# Patient Record
Sex: Male | Born: 1975 | Race: White | Hispanic: No | Marital: Single | Smoking: Never smoker
Health system: Southern US, Community
[De-identification: ages and names within clinical notes are randomized; demographics above are authoritative.]

---

## 2004-10-26 ENCOUNTER — Emergency Department: Payer: Self-pay | Admitting: Emergency Medicine

## 2006-03-28 IMAGING — CR DG KNEE COMPLETE 4+V*L*
1 series · 4 of 4 positions shown · non-contrast
Comparison: none

REASON FOR EXAM: injury
COMMENTS:

PROCEDURE:     DXR - DXR KNEE LT COMP WITH OBLIQUES  - October 26, 2004 [DATE]
RESULT:       Multiple views reveals no fractures or dislocations.  The
joint space is intact.  No joint effusion is identified.

[Series 1: view not recorded · 0.17mm/px · 4 of 4 slices shown]
[im 1/4]
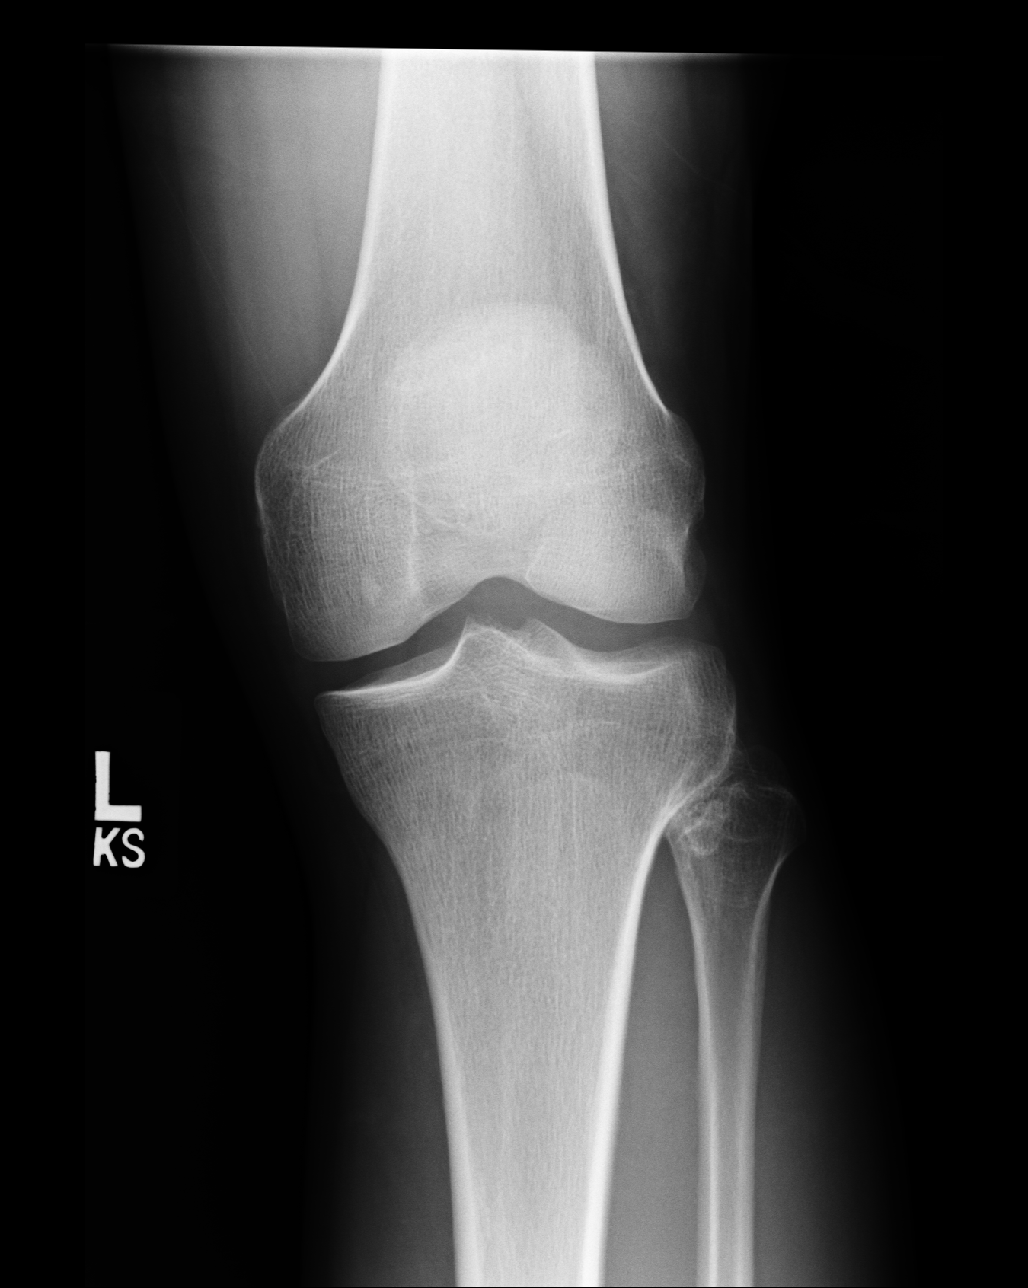
[im 2/4]
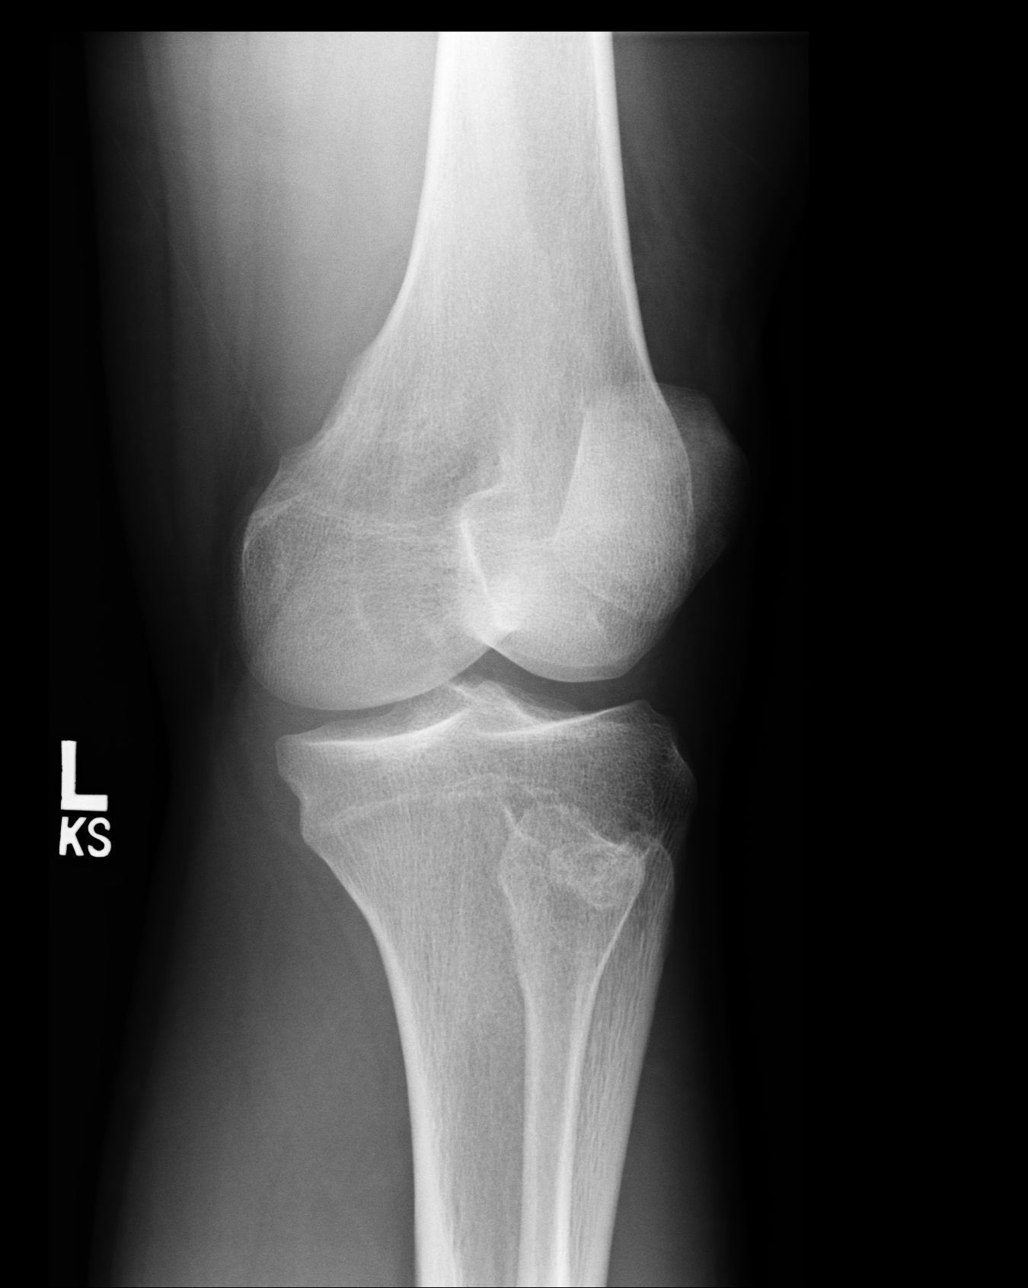
[im 3/4]
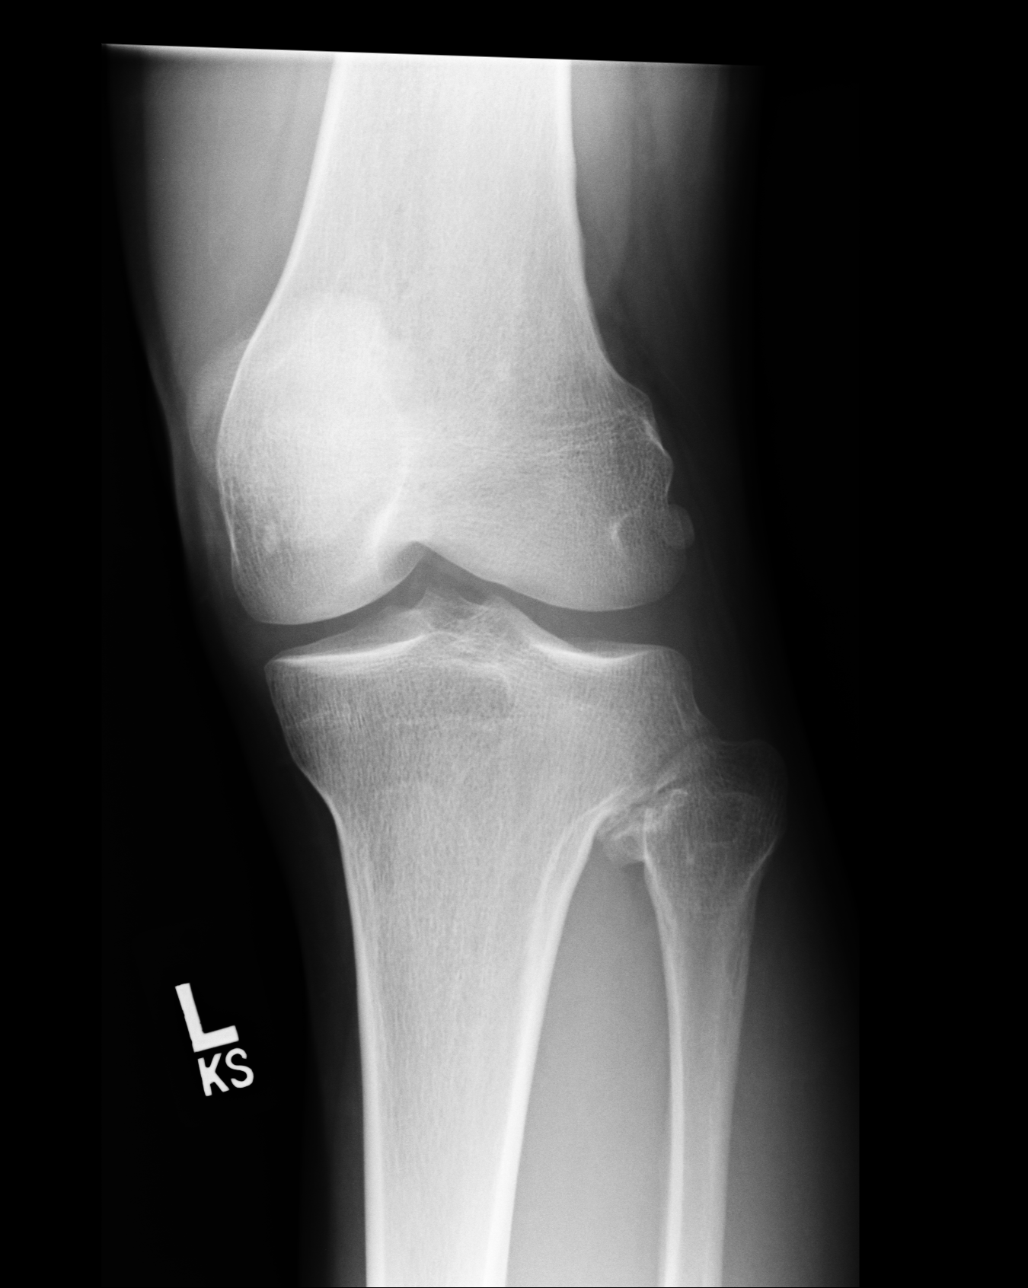
[im 4/4]
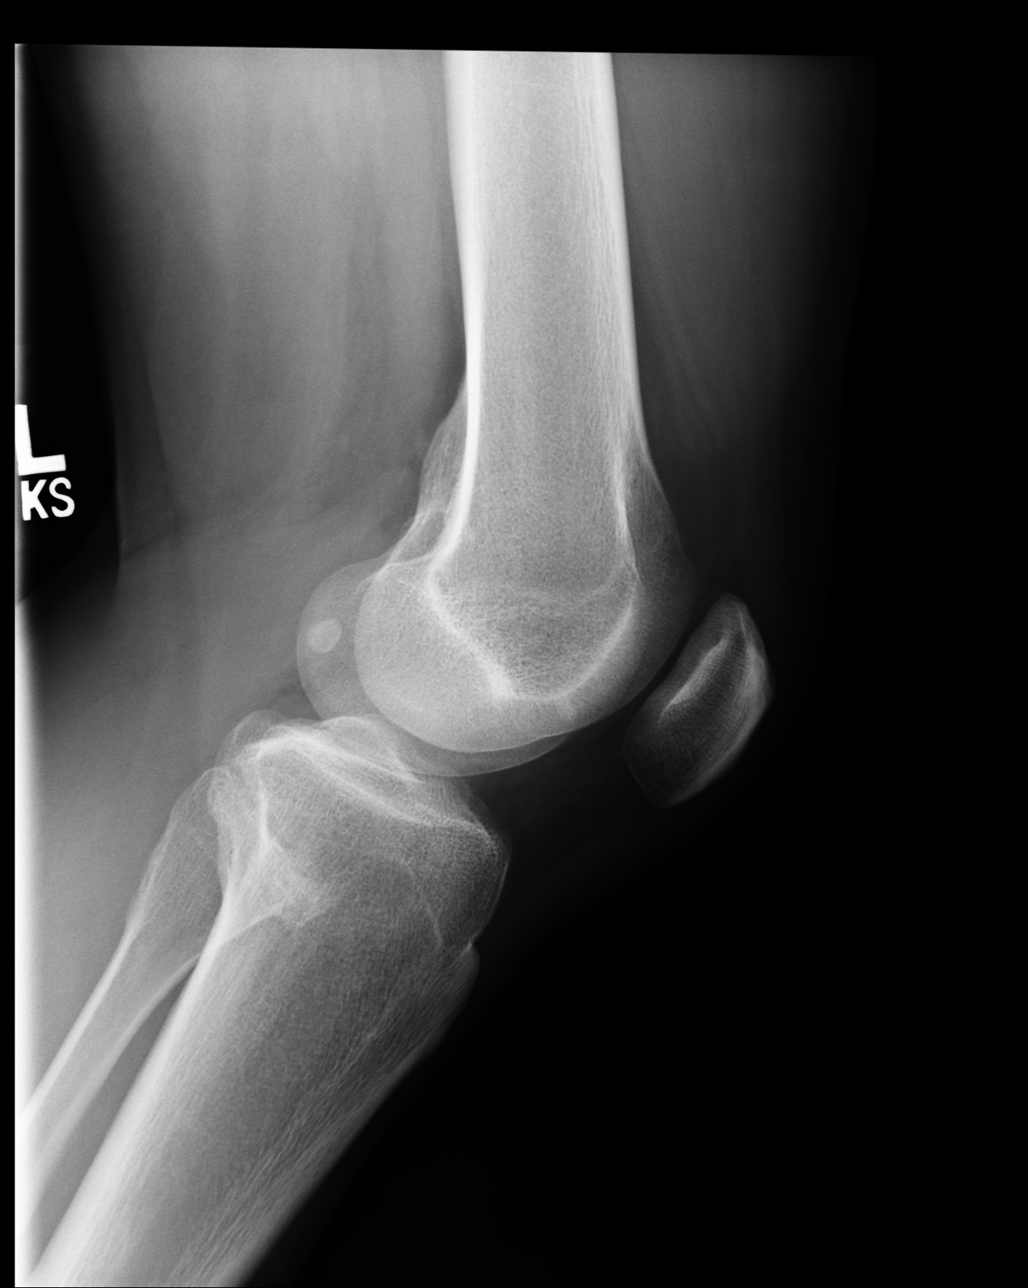

[4 of 4 positions shown; findings below may reference images not displayed]

IMPRESSION: No acute fracture seen of the LEFT knee.

## 2010-10-24 ENCOUNTER — Ambulatory Visit: Payer: Self-pay | Admitting: Family Medicine

## 2010-10-30 ENCOUNTER — Ambulatory Visit: Payer: Self-pay | Admitting: Family Medicine

## 2010-11-04 ENCOUNTER — Ambulatory Visit: Payer: Self-pay | Admitting: Family Medicine

## 2012-03-25 IMAGING — CR DG LUMBAR SPINE COMPLETE 4+V
1 series · 5 of 5 positions shown · non-contrast
Comparison: none

REASON FOR EXAM: lower back pain due to work injury
COMMENTS:

PROCEDURE:     MDR - M[REDACTED] SPINE WITH OBLIQUES  - October 24, 2010  [DATE]
RESULT:     Comparison: None.

[Series 1: view not recorded · 0.17mm/px · 5 of 5 slices shown]
[im 1/5]
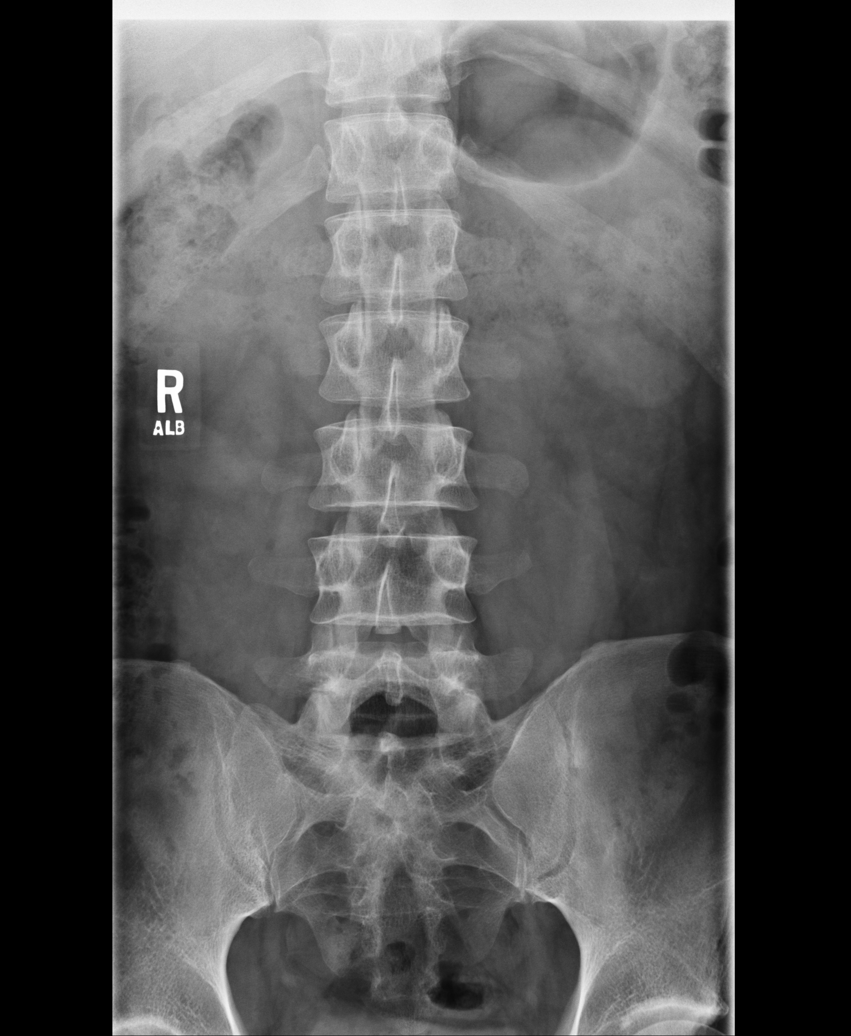
[im 2/5]
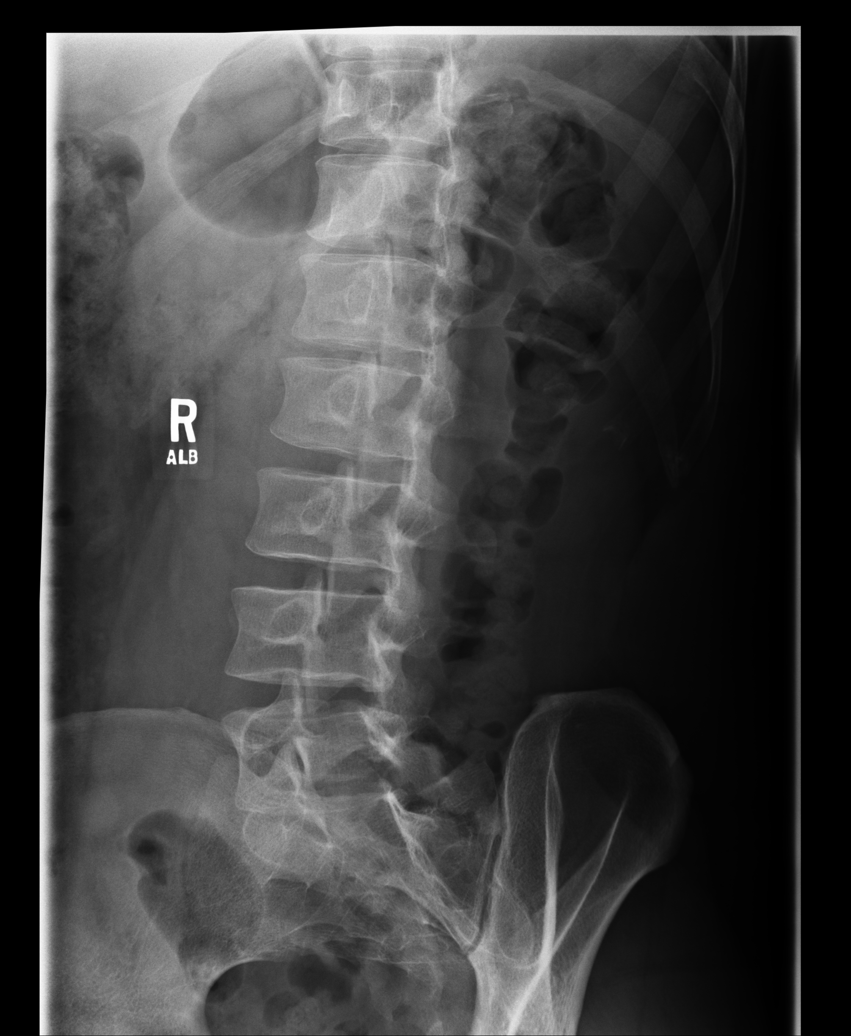
[im 3/5]
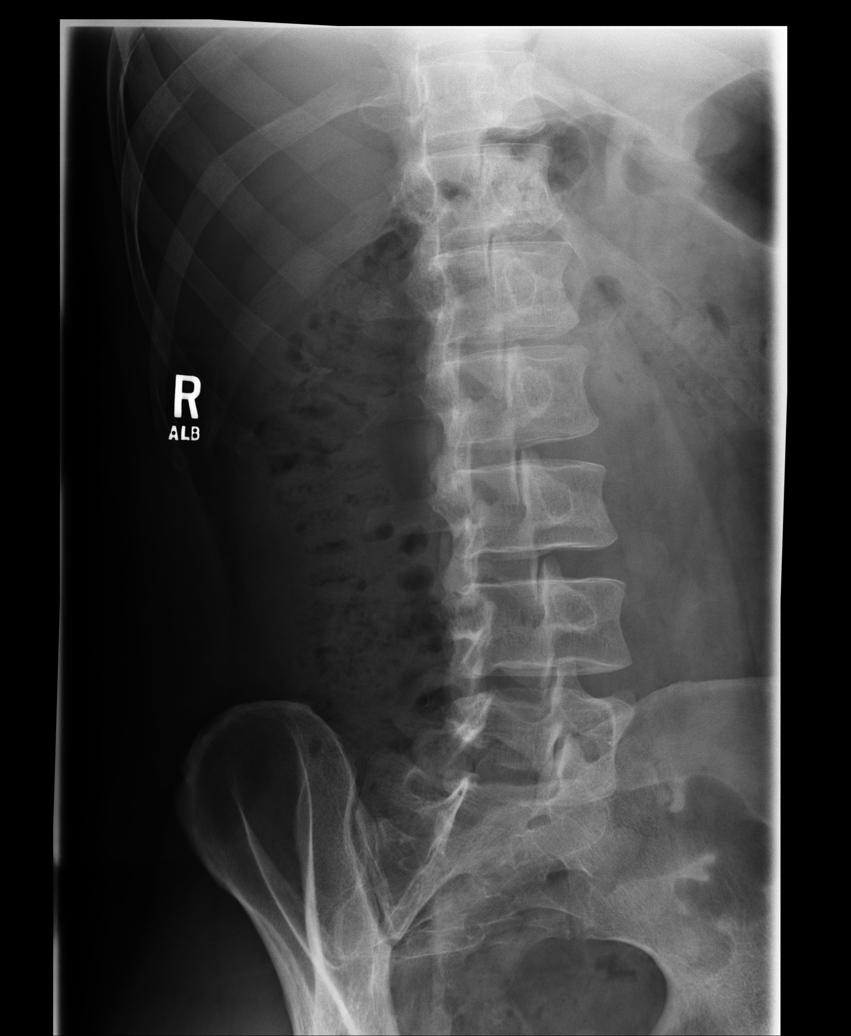
[im 4/5]
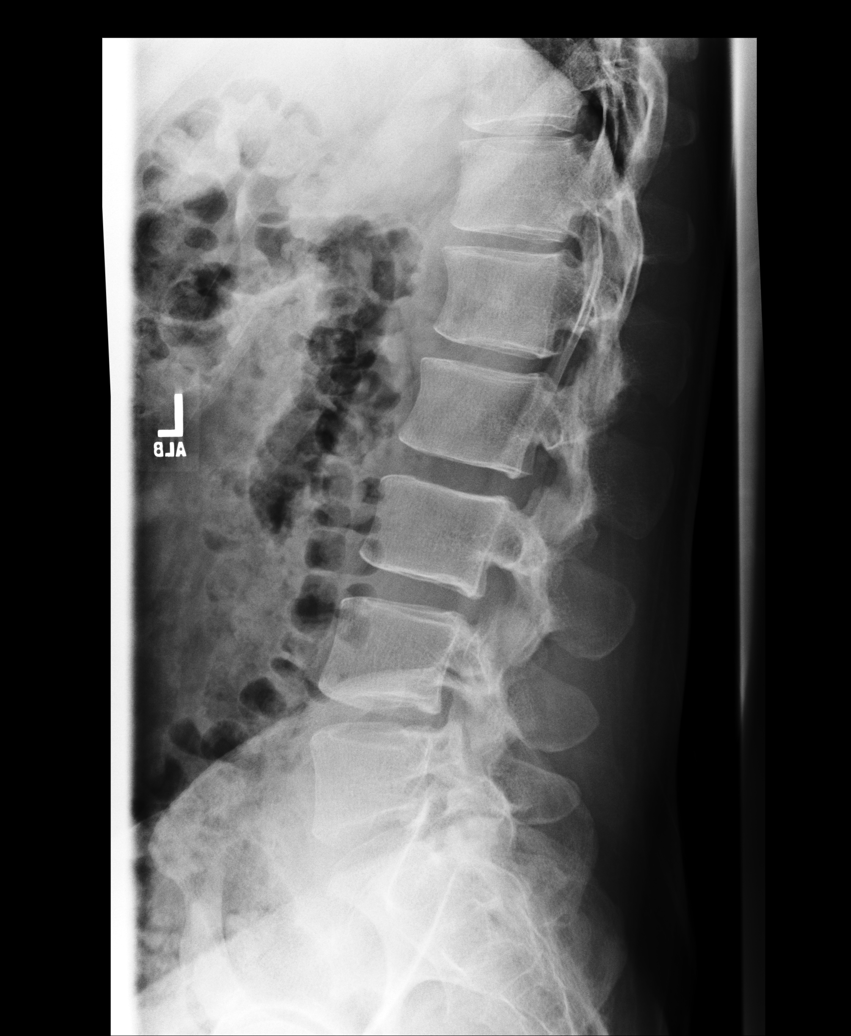
[im 5/5]
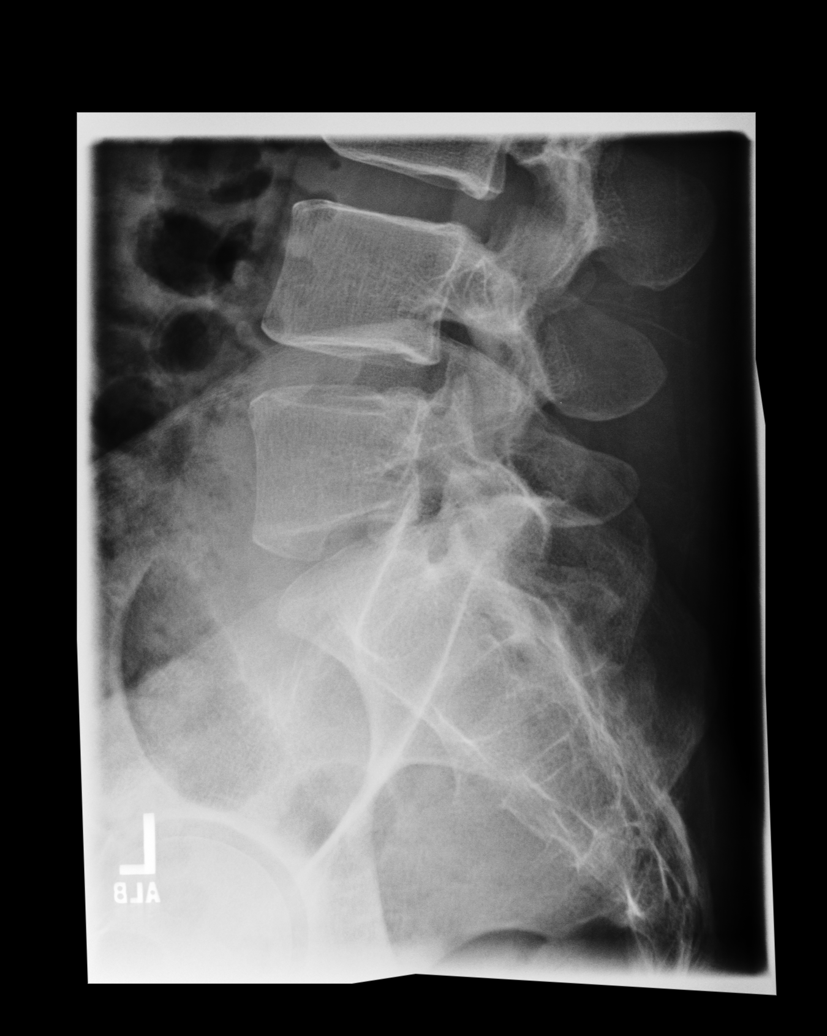

[5 of 5 positions shown; findings below may reference images not displayed]

FINDINGS: There are 5 lumbar type vertebral bodies. No pars defect seen on the oblique
views. Vertebral body heights and intervertebral disc heights are relatively
preserved. There is normal alignment.
IMPRESSION: No acute findings.

## 2013-08-10 ENCOUNTER — Emergency Department: Payer: Self-pay | Admitting: Emergency Medicine

## 2014-07-17 ENCOUNTER — Ambulatory Visit: Payer: Self-pay | Admitting: Emergency Medicine

## 2020-05-23 ENCOUNTER — Encounter: Payer: Self-pay | Admitting: Urology

## 2020-05-23 ENCOUNTER — Other Ambulatory Visit: Payer: Self-pay

## 2020-05-23 ENCOUNTER — Ambulatory Visit (INDEPENDENT_AMBULATORY_CARE_PROVIDER_SITE_OTHER): Payer: No Typology Code available for payment source | Admitting: Urology

## 2020-05-23 VITALS — BP 126/73 | HR 55 | Ht 75.0 in | Wt 218.0 lb

## 2020-05-23 DIAGNOSIS — E291 Testicular hypofunction: Secondary | ICD-10-CM

## 2020-05-23 DIAGNOSIS — N529 Male erectile dysfunction, unspecified: Secondary | ICD-10-CM | POA: Diagnosis not present

## 2020-05-23 NOTE — Progress Notes (Signed)
05/23/2020 3:30 PM   Raymond Kaiser Nov 17, 1975 [id number]  Referring provider: Marland Kitchen, FNP No address on file  Chief Complaint  Patient presents with  . Erectile Dysfunction    HPI: Raymond Kaiser is a 45 y.o. male seen at the request of Harrison Mons, FNP for evaluation of erectile dysfunction.   Married at age 31 and divorced 4 years later  Was abstinent for 8 years after divorce  Has attempted intercourse x2 with 2 different women in the last 4 years  States he achieves a an erection that is firm enough for penetration but there is decreased rigidity and he has difficulty maintaining the erection  He has tried sildenafil 50 mg twice, he had a headache with the first dose and did not feel it was effective but does feel there was benefit with the second use  No significant organic risk factors including diabetes, hypertension, hyperlipidemia, neurologic disease or previous smoking history  He did use smokeless tobacco x20 years and quit 5 years ago  No bothersome LUTS  No dysuria or gross hematuria  Was diagnosed with hypogonadism after experiencing significant tiredness and fatigue and currently undergoing compounded pellet implantation  He is getting ready to transition to testosterone injections because the pellets are starting to extrude since he has lost weight  He gets blood work every 3 months and states testosterone levels have been in the 700-900 range with resolution of his tiredness and fatigue  Was started on anastrozole weekly after estradiol levels increased  States he gets his PSA every 3 months and this has been stable  Blood work was not available for review  The primary reason for his visit today was for assessment of any organic cause of his ED   PMH: History reviewed. No pertinent past medical history.  Surgical History: History reviewed. No pertinent surgical history.  Home Medications:  Allergies as of 05/23/2020      Reactions    Hydrocodone-acetaminophen Hives   Oxaprozin Hives      Medication List       Accurate as of May 23, 2020  3:30 PM. If you have any questions, ask your nurse or doctor.        anastrozole 1 MG tablet Commonly known as: ARIMIDEX Take 1 mg by mouth as directed.   sildenafil 50 MG tablet Commonly known as: VIAGRA Take by mouth.       Allergies:  Allergies  Allergen Reactions  . Hydrocodone-Acetaminophen Hives  . Oxaprozin Hives    Family History: History reviewed. No pertinent family history.  Social History:  reports that he has never smoked. He has quit using smokeless tobacco. No history on file for alcohol use and drug use.   Physical Exam: BP 126/73   Pulse (!) 55   Ht 6\' 3"  (1.905 m)   Wt 218 lb (98.9 kg)   BMI 27.25 kg/m   Constitutional:  Alert and oriented, No acute distress. HEENT: [place] AT, moist mucus membranes.  Trachea midline, no masses. Cardiovascular: No clubbing, cyanosis, or edema. Respiratory: Normal respiratory effort, no increased work of breathing. GI: Abdomen is soft, nontender, nondistended, no abdominal masses GU: Phallus without lesions, testes descended bilaterally without masses or tenderness, 20 cc volume bilaterally Skin: No rashes, bruises or suspicious lesions. Neurologic: Grossly intact, no focal deficits, moving all 4 extremities. Psychiatric: Normal mood and affect.   Assessment & Plan:    1.  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  Hypogonadism  Currently on testosterone pellets and transitioning to intramuscular injections  TRT performed by an outside provider which he will continue  I spent 45 total minutes on the day of the encounter including pre-visit review of the medical record, face-to-face time with the patient, and post visit ordering of labs/imaging/tests.   Riki Altes, MD  [facility] [address] [place], [place] [address] [phone]

## 2020-05-25 ENCOUNTER — Encounter: Payer: Self-pay | Admitting: Urology

## 2021-06-10 ENCOUNTER — Telehealth: Payer: Self-pay | Admitting: Urology

## 2021-06-10 NOTE — Telephone Encounter (Signed)
Called patient to schedule an appointment and there was no answer and no voicemail is set up

## 2021-06-10 NOTE — Telephone Encounter (Signed)
Patient called and stated that at his last appt he and Dr Lonna Cobb discussed the pt trying Tadalafil for his ED.  He stated that he would like to have that sent to [facility] on [address] in [place] or [place]

## 2021-06-26 ENCOUNTER — Encounter: Payer: Self-pay | Admitting: Urology

## 2021-06-26 ENCOUNTER — Ambulatory Visit (INDEPENDENT_AMBULATORY_CARE_PROVIDER_SITE_OTHER): Payer: BC Managed Care – PPO | Admitting: Urology

## 2021-06-26 ENCOUNTER — Other Ambulatory Visit: Payer: Self-pay

## 2021-06-26 VITALS — BP 136/74 | HR 65 | Ht 75.0 in | Wt 225.0 lb

## 2021-06-26 DIAGNOSIS — N529 Male erectile dysfunction, unspecified: Secondary | ICD-10-CM

## 2021-06-26 MED ORDER — TADALAFIL 5 MG PO TABS: 5.0000 mg | ORAL_TABLET | Freq: Every day | ORAL | 3 refills | Status: AC | PRN

## 2021-06-26 NOTE — Progress Notes (Signed)
° °  06/26/2021 9:02 AM   Raymond Kaiser 06/19/1975 [id number]  Referring provider: No referring provider defined for this encounter.  Chief Complaint  Patient presents with   Erectile Dysfunction    HPI: Raymond Kaiser is a 46 y.o. male who presents for follow-up of erectile dysfunction.  Initially seen 05/23/2018 for ED.  Refer to that note for details History of hypogonadism and on TRT/anastrozole managed by another provider  Seen at last year and had been using sildenafil for ED We had discussed tadalafil this was not effective and he has been using tadalafil 5 mg daily that he obtained online which he states has been effective and would like to continue   PMH: No past medical history on file.  Surgical History: No past surgical history on file.  Home Medications:  Allergies as of 06/26/2021       Reactions   Hydrocodone-acetaminophen Hives   Oxaprozin Hives        Medication List        Accurate as of June 26, 2021  9:02 AM. If you have any questions, ask your nurse or doctor.          anastrozole 1 MG tablet Commonly known as: ARIMIDEX Take 1 mg by mouth as directed.   sildenafil 50 MG tablet Commonly known as: VIAGRA Take by mouth.   tadalafil 5 MG tablet Commonly known as: CIALIS Take 1 tablet (5 mg total) by mouth daily as needed for erectile dysfunction. Started by: Raymond Altes, MD   testosterone cypionate 200 MG/ML injection Commonly known as: DEPOTESTOSTERONE CYPIONATE Inject into the muscle.        Allergies:  Allergies  Allergen Reactions   Hydrocodone-Acetaminophen Hives   Oxaprozin Hives    Family History: No family history on file.  Social History:  reports that he has never smoked. He has quit using smokeless tobacco. No history on file for alcohol use and drug use.   Physical Exam: BP 136/74    Pulse 65    Ht 6\' 3"  (1.905 m)    Wt 225 lb (102.1 kg)    BMI 28.12 kg/m   Constitutional:  Alert and oriented, No  acute distress. HEENT:  AT, moist mucus membranes.  Trachea midline, no masses. Cardiovascular: No clubbing, cyanosis, or edema. Respiratory: Normal respiratory effort, no increased work of breathing. Psychiatric: Normal mood and affect.   Assessment & Plan:    1.  Erectile dysfunction Tadalafil 5 mg daily has been effective Rx sent to [facility] and he was given a good Rx card Annual follow-up for refills   , MD  [facility] [address] [place], [place] [id number]

## 2022-06-26 ENCOUNTER — Ambulatory Visit: Payer: BC Managed Care – PPO | Admitting: Urology
